# Patient Record
Sex: Male | Born: 1965 | Race: White | Hispanic: No | Marital: Married | Smoking: Never smoker
Health system: Southern US, Community
[De-identification: ages and names within clinical notes are randomized; demographics above are authoritative.]

## PROBLEM LIST (undated history)

## (undated) HISTORY — PX: APPENDECTOMY: SHX54

## (undated) HISTORY — PX: FRACTURE SURGERY: SHX138

---

## 2007-10-28 ENCOUNTER — Inpatient Hospital Stay: Payer: Self-pay | Admitting: Specialist

## 2007-11-05 ENCOUNTER — Emergency Department: Payer: Self-pay | Admitting: Emergency Medicine

## 2007-11-05 ENCOUNTER — Other Ambulatory Visit: Payer: Self-pay

## 2008-02-13 ENCOUNTER — Ambulatory Visit: Payer: Self-pay | Admitting: Family Medicine

## 2008-02-20 ENCOUNTER — Ambulatory Visit: Payer: Self-pay | Admitting: Family Medicine

## 2008-07-02 ENCOUNTER — Ambulatory Visit: Payer: Self-pay | Admitting: Family Medicine

## 2009-02-22 ENCOUNTER — Emergency Department: Payer: Self-pay | Admitting: Emergency Medicine

## 2009-05-18 IMAGING — CR DG CHEST 2V
1 series · 2 of 2 positions shown · non-contrast
Comparison: none

REASON FOR EXAM: fall 15 feet
COMMENTS:

PROCEDURE:     DXR - DXR CHEST PA (OR AP) AND LATERAL  - October 28, 2007  [DATE]
RESULT:     Comparison: No available comparison exam.

[Series 1: view not recorded · 0.17mm/px · 2 of 2 slices shown]
[im 1/2]
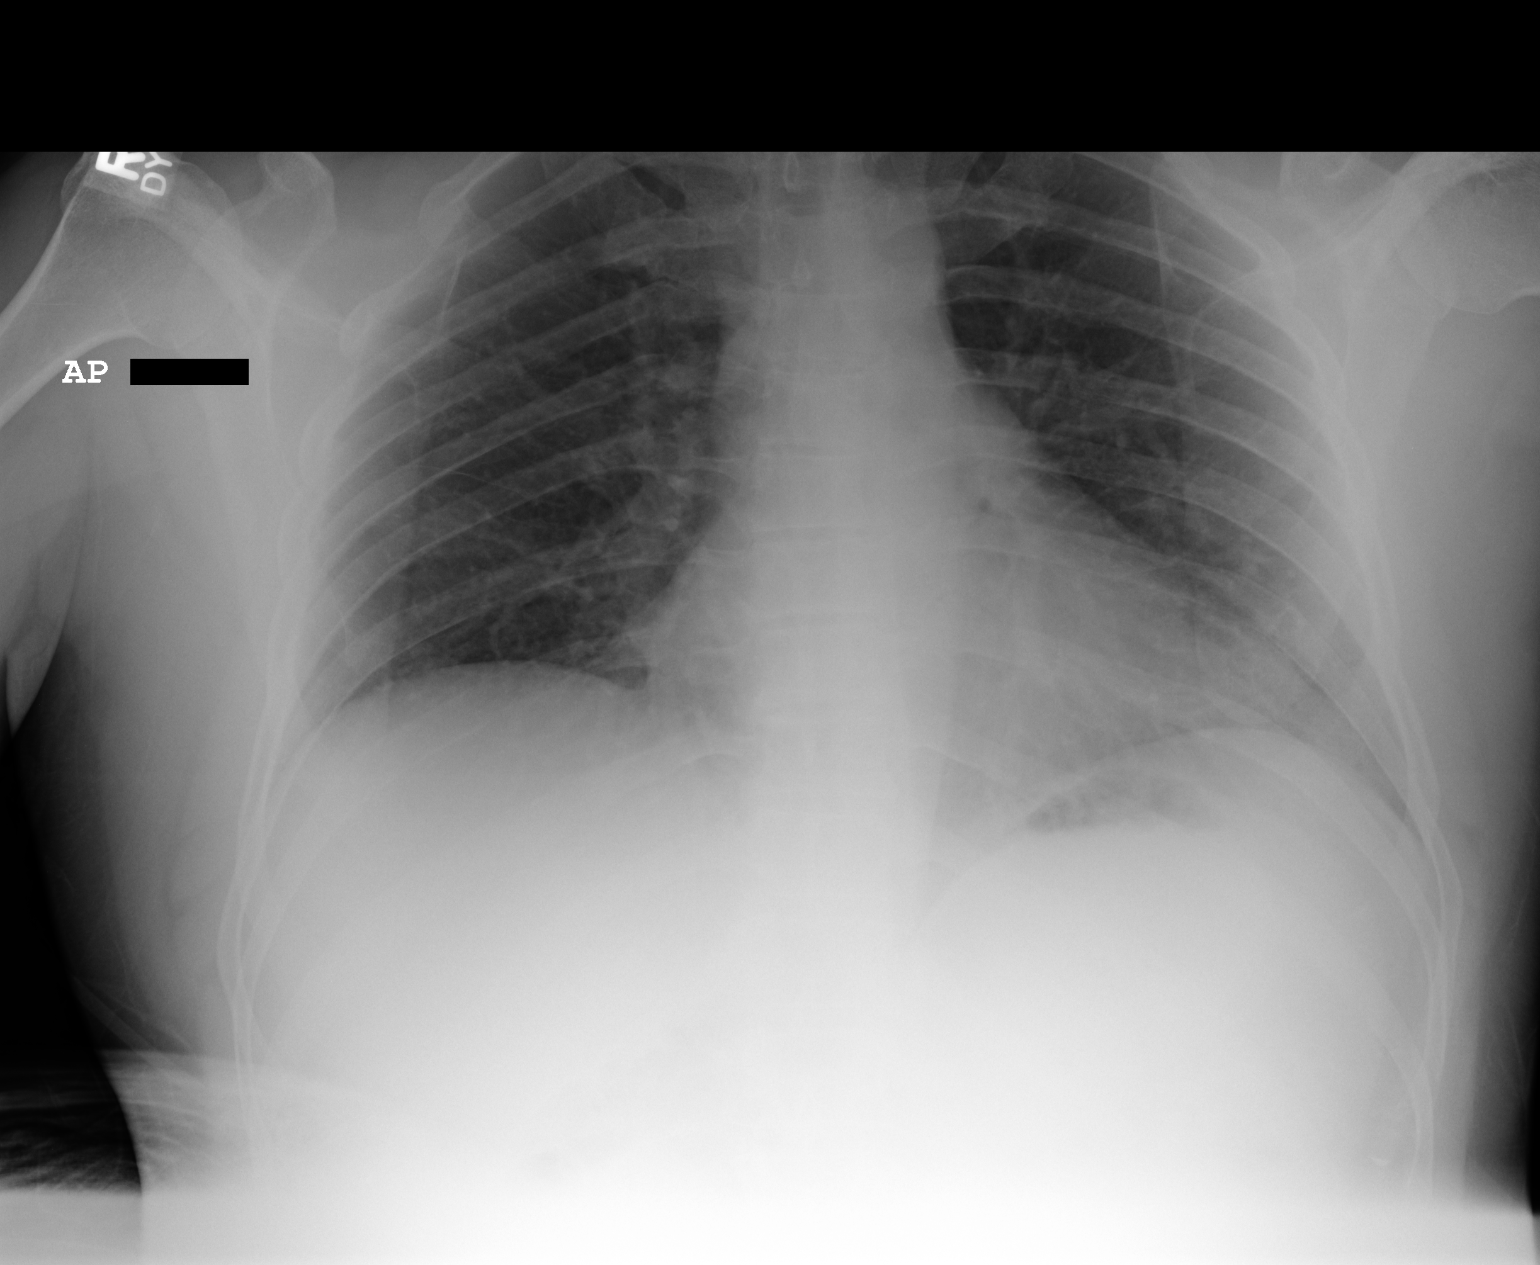
[im 2/2]
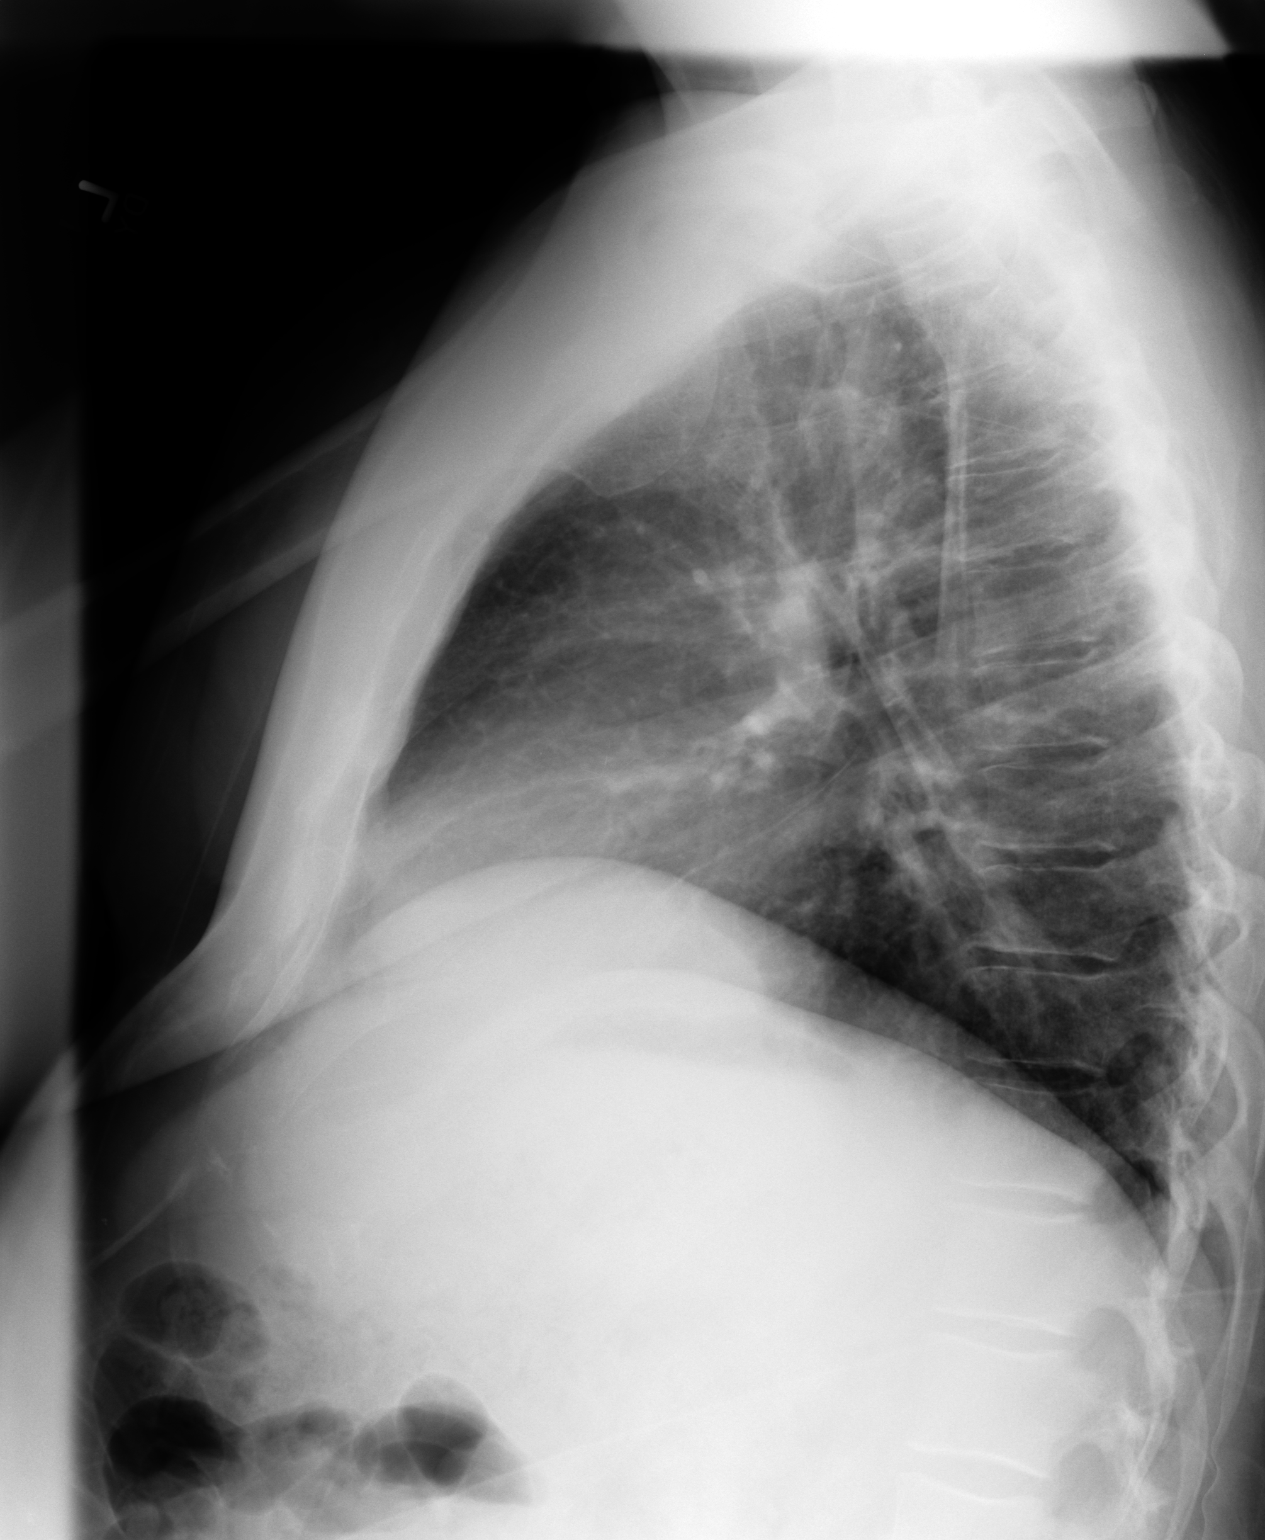

[2 of 2 positions shown; findings below may reference images not displayed]

FINDINGS: Low lung volumes. There is no significant pulmonary consolidation, pulmonary
edema, pleural effusion, nor pneumothorax. The cardiomediastinal silhouette
is within normal limits.

No grossly displaced rib fracture is noted.
IMPRESSION: 1. No acute cardiopulmonary abnormality is noted.

## 2009-05-26 IMAGING — US US EXTREM LOW VENOUS*R*
1 series · 17 of 24 positions shown · non-contrast
Comparison: none

REASON FOR EXAM: pain swelling postop
COMMENTS:

[Series 1: us extrem low venous*right* · 17 of 28 slices shown]
[im 1/28]
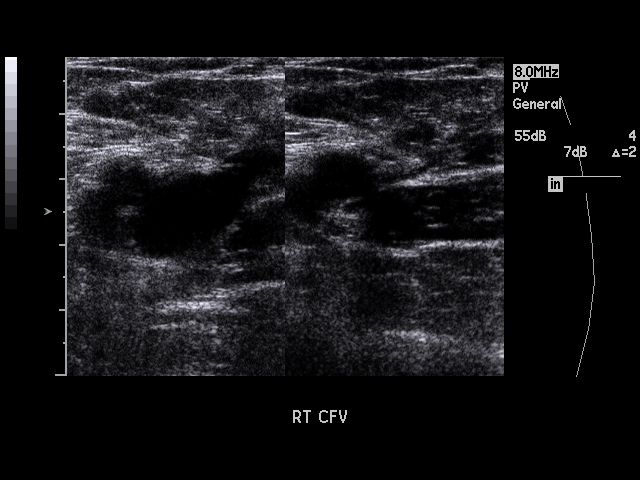
[im 3/28]
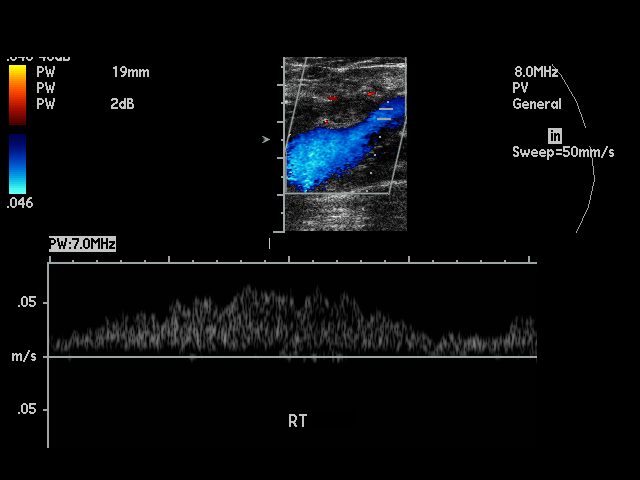
[im 4/28]
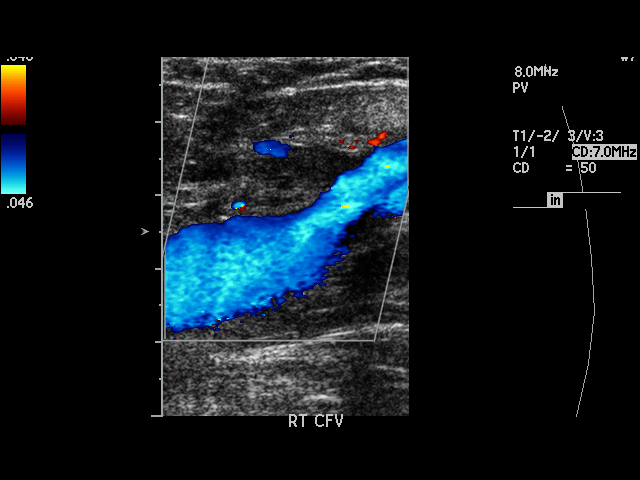
[im 5/28]
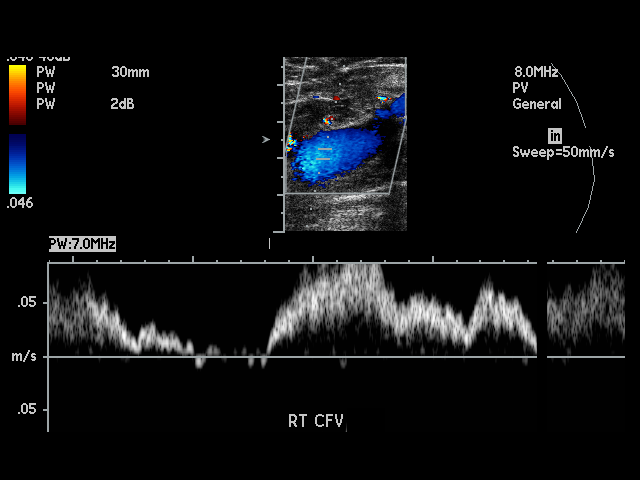
[im 8/28]
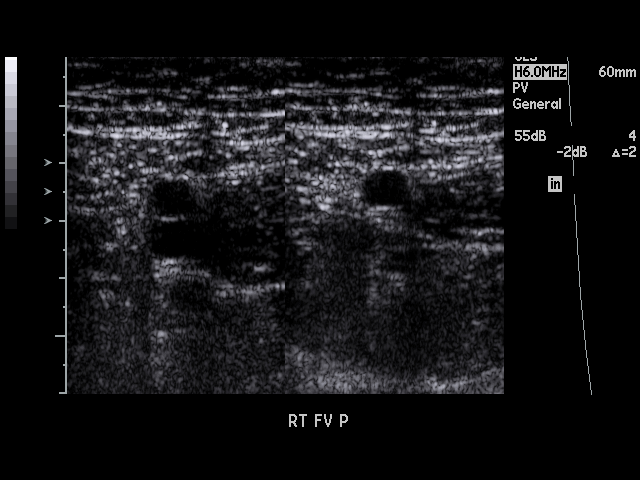
[im 9/28]
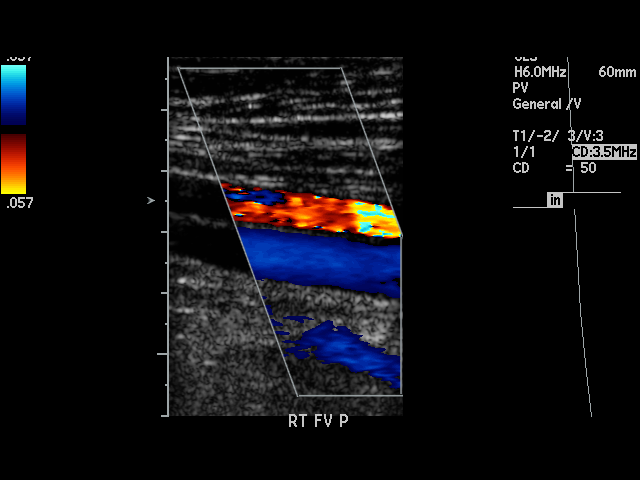
[im 11/28]
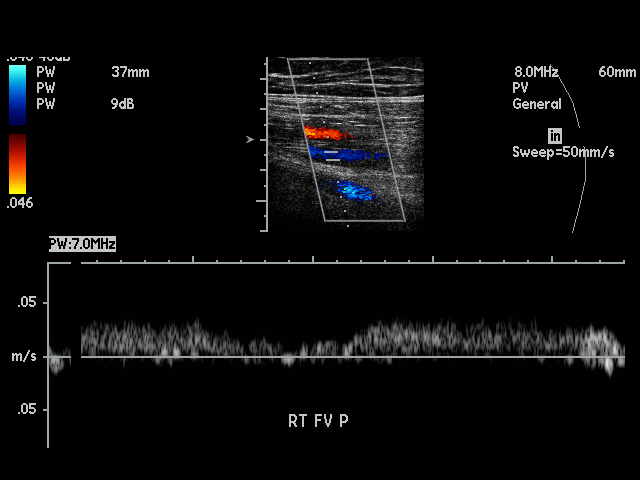
[im 12/28]
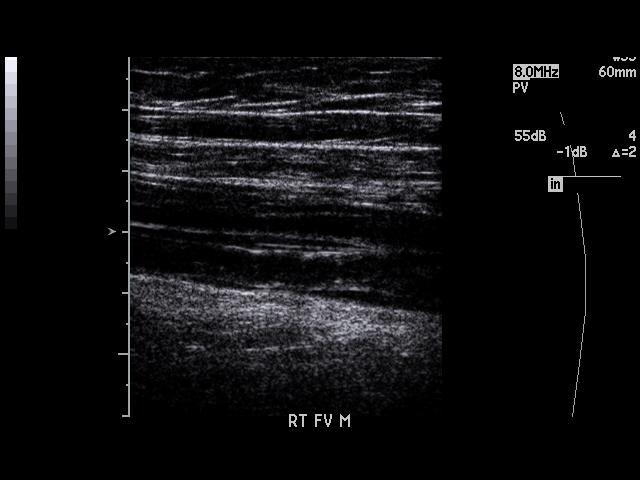
[im 15/28]
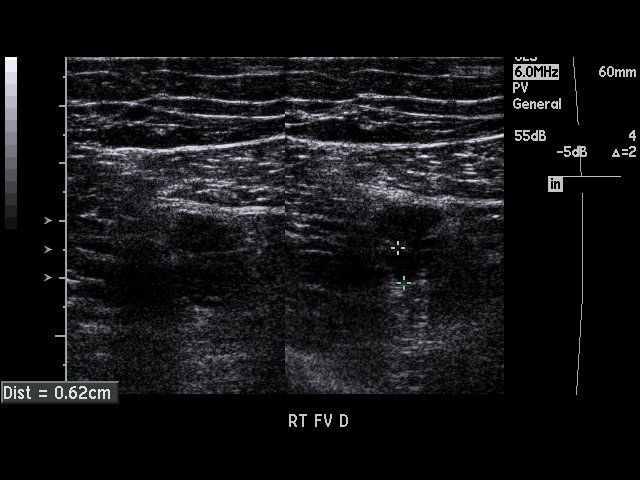
[im 16/28]
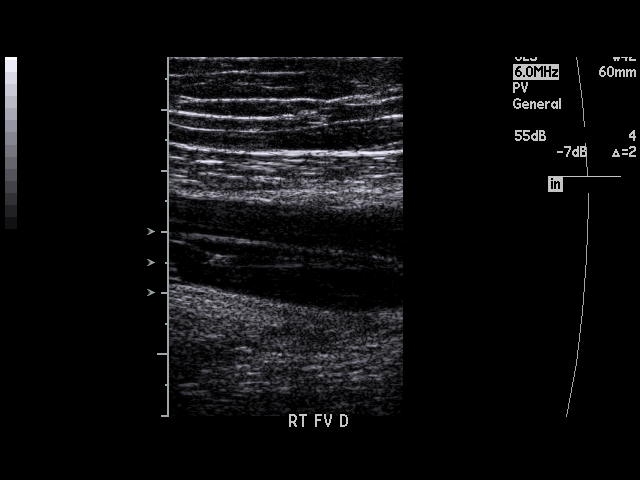
[im 17/28]
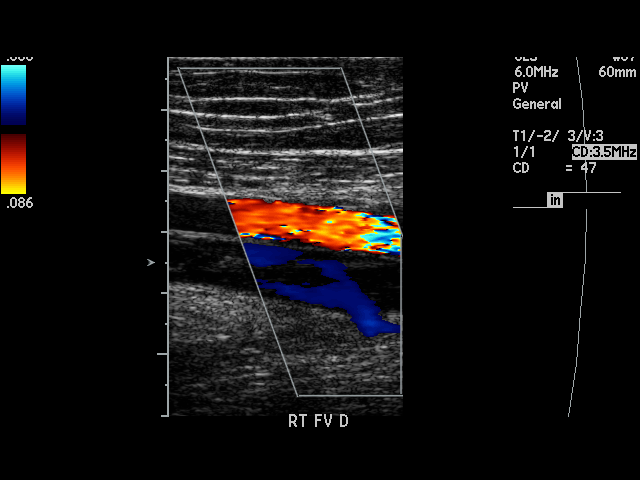
[im 19/28]
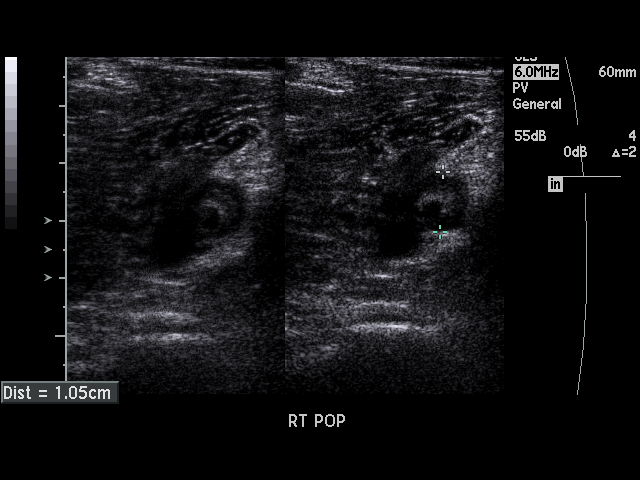
[im 20/28]
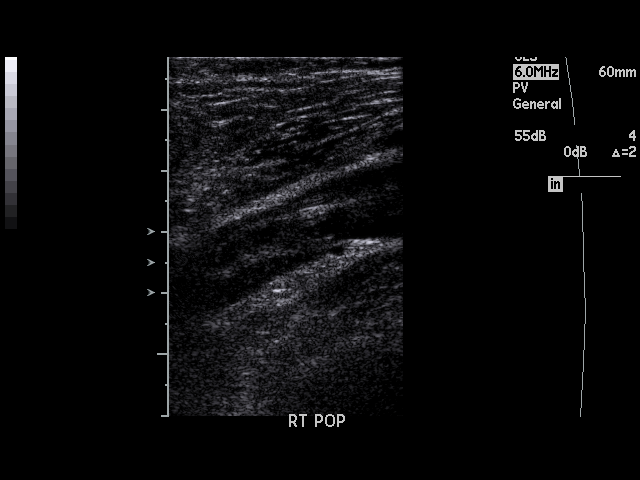
[im 23/28]
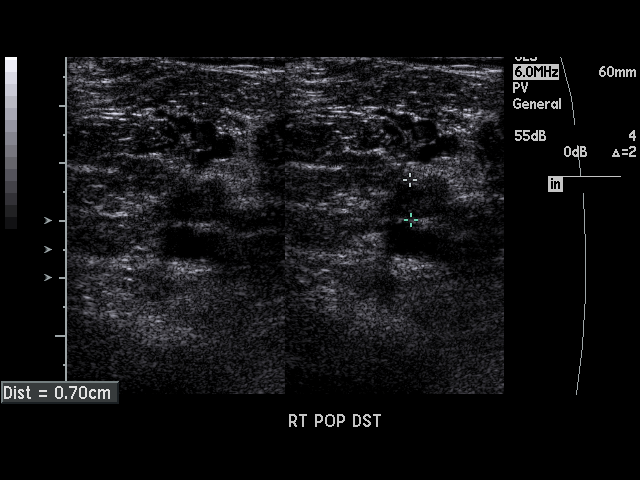
[im 24/28]
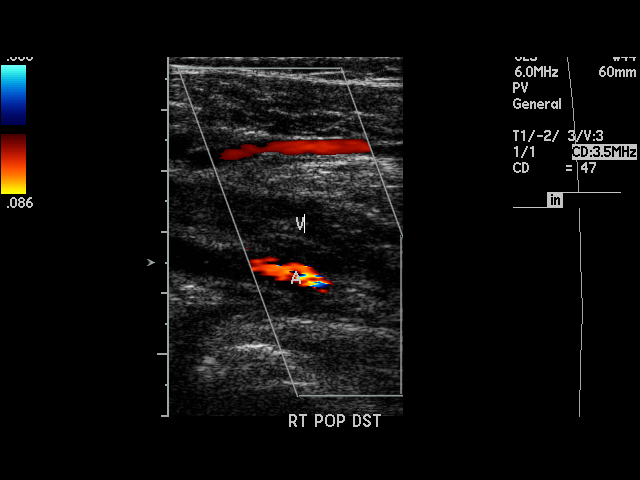
[im 25/28]
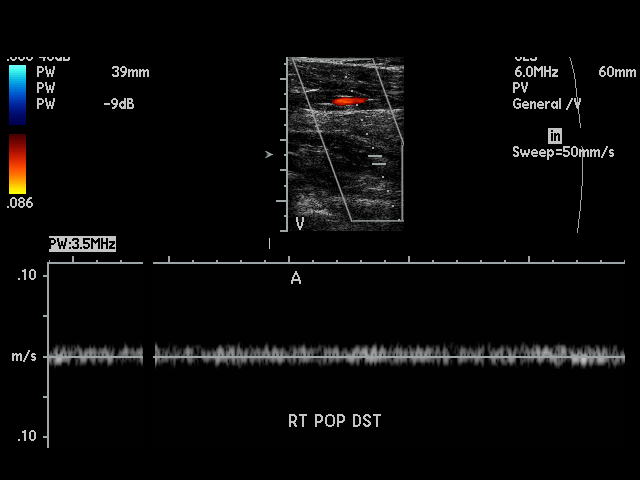
[im 28/28]
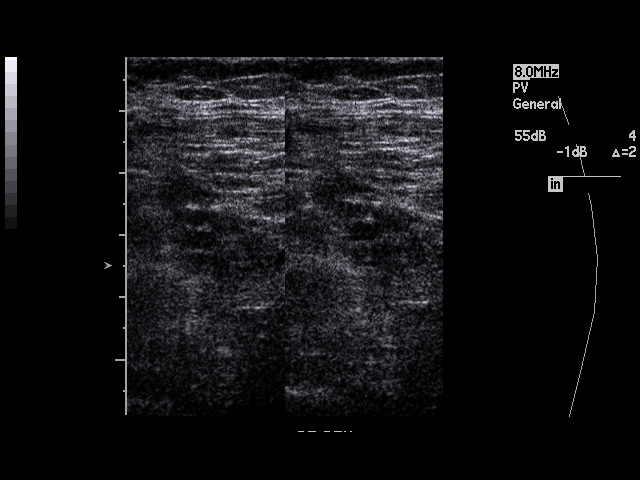

[17 of 24 positions shown; findings below may reference images not displayed]

PROCEDURE:     US  - US DOPPLER LOW EXTR RIGHT  - November 05, 2007  [DATE]

RESULT:     The RIGHT femoral and popliteal veins were evaluated. There is
obstructing thrombus present in the popliteal artery.   There is
nonocclusive clot in the mid to distal superficial femoral vein. The common
femoral vein exhibits no evidence of thrombus.
IMPRESSION: There is obstructing thrombus in the right popliteal vein
and nonobstructing thrombus in the mid and distal right superficial femoral
vein.

This report was called to the [HOSPITAL] the conclusion of the
study.

## 2009-09-03 IMAGING — US US EXTREM LOW VENOUS*R*
1 series · 17 of 24 positions shown · non-contrast
Comparison: none

REASON FOR EXAM: DVT right leg    3 Lex follow up after treatment
COMMENTS:

[Series 1: us extrem low venous*right* · 17 of 31 slices shown]
[im 1/31]
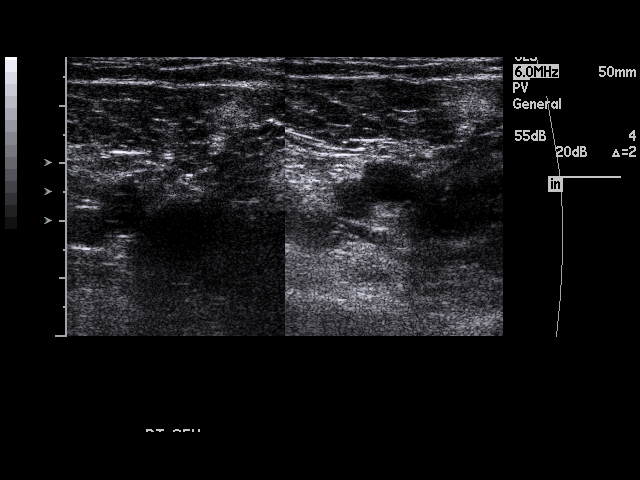
[im 3/31]
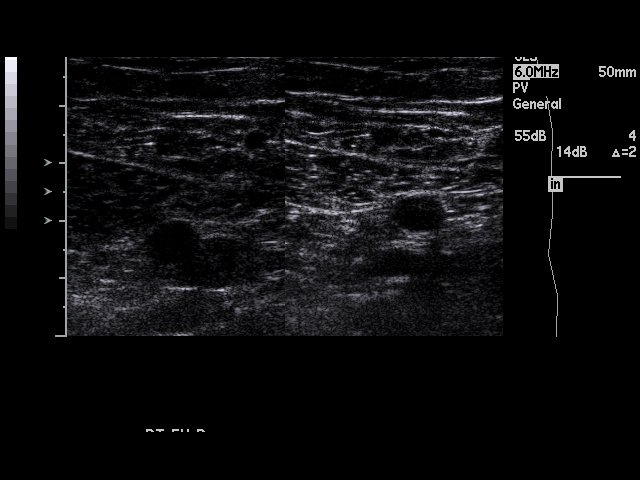
[im 4/31]
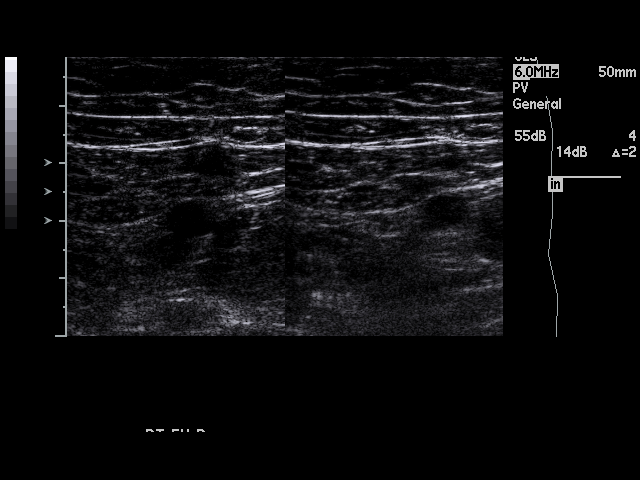
[im 6/31]
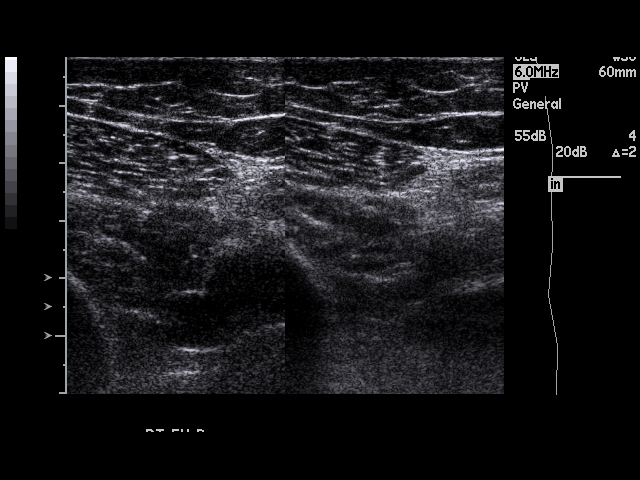
[im 8/31]
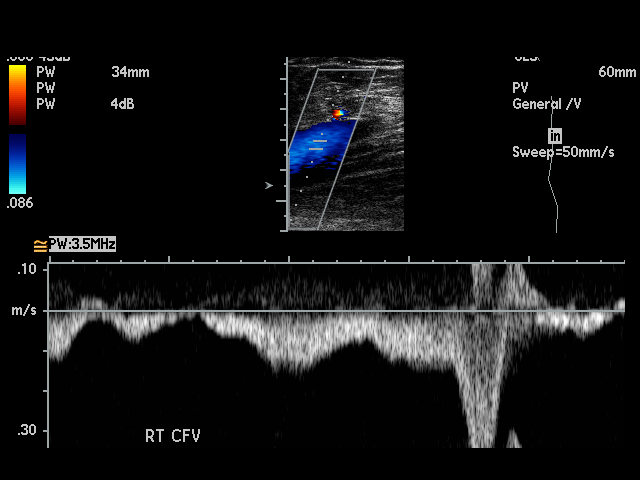
[im 10/31]
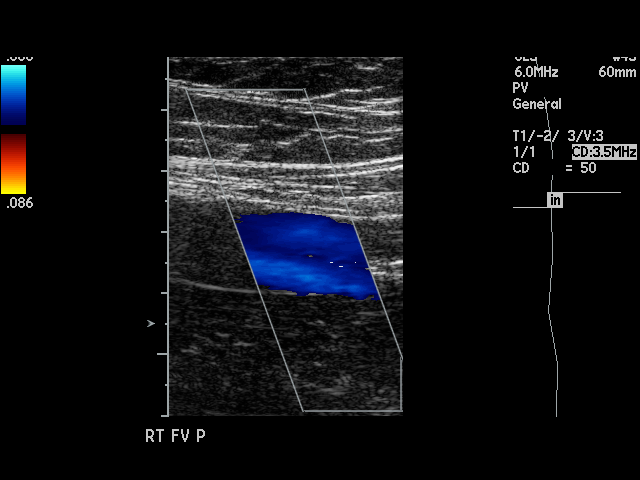
[im 12/31]
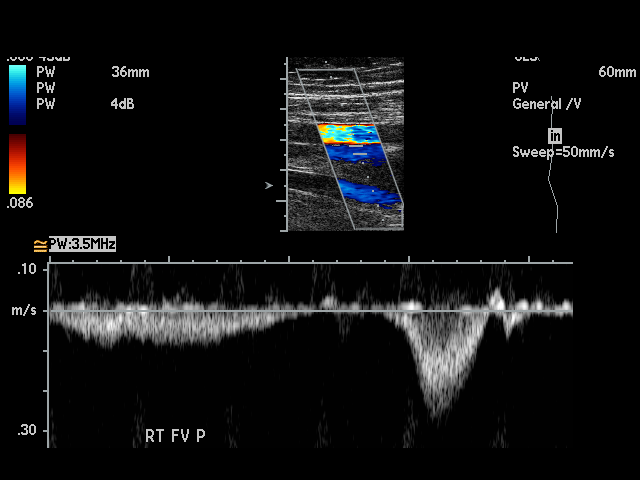
[im 14/31]
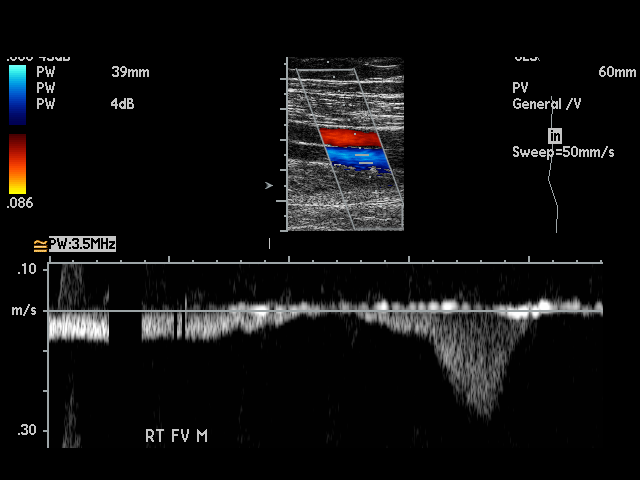
[im 16/31]
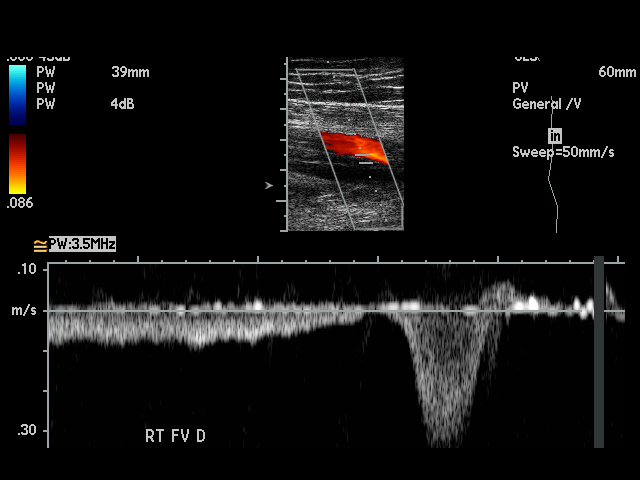
[im 17/31]
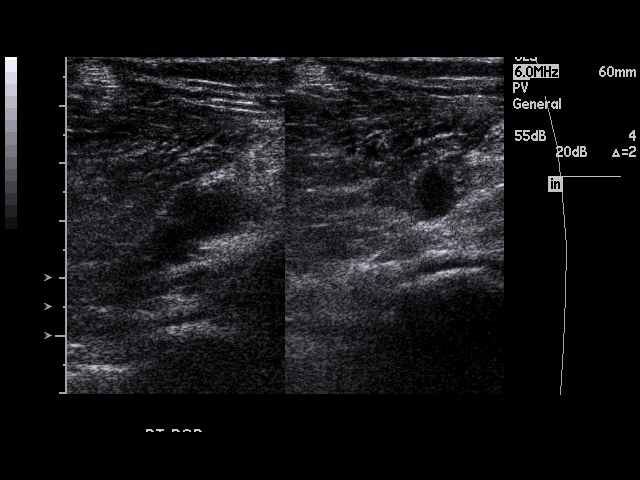
[im 19/31]
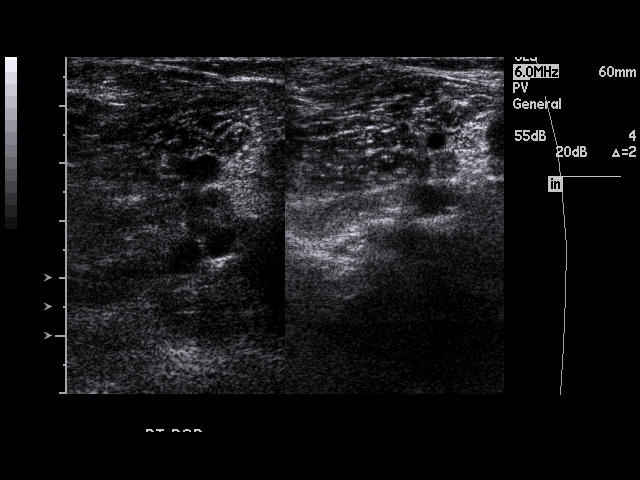
[im 21/31]
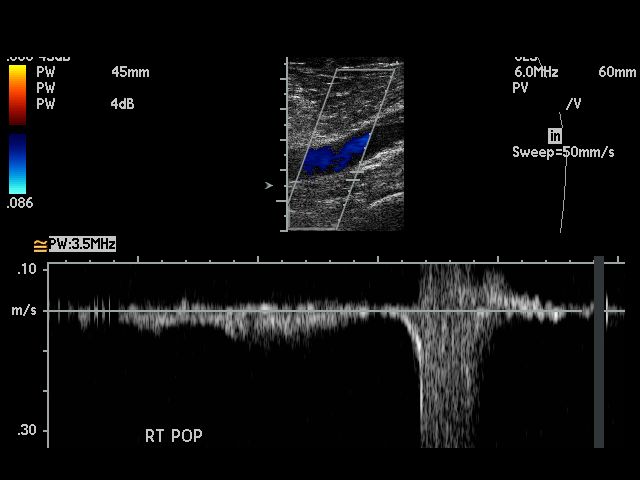
[im 23/31]
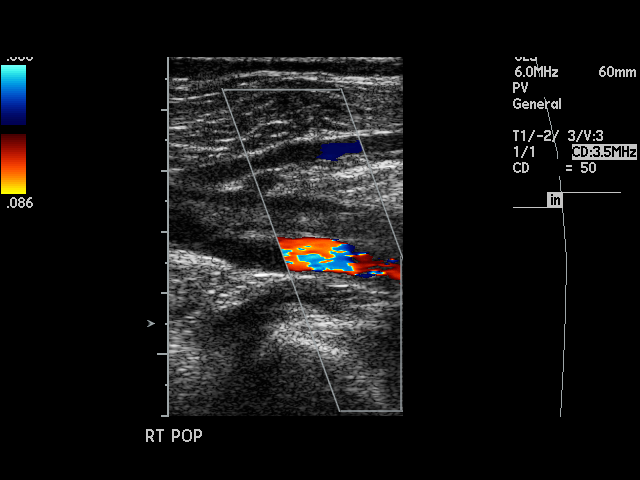
[im 25/31]
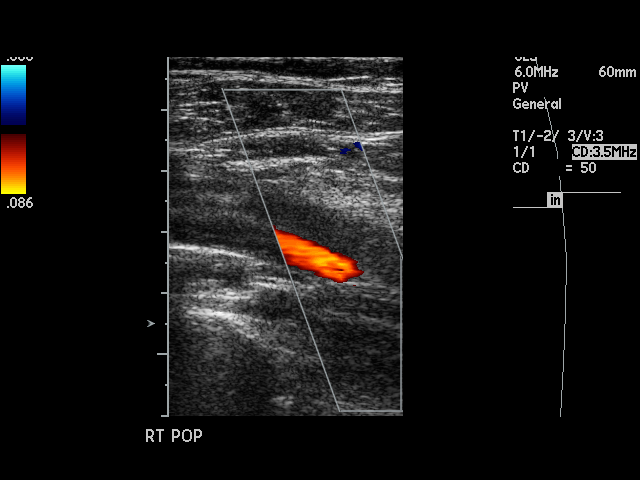
[im 27/31]
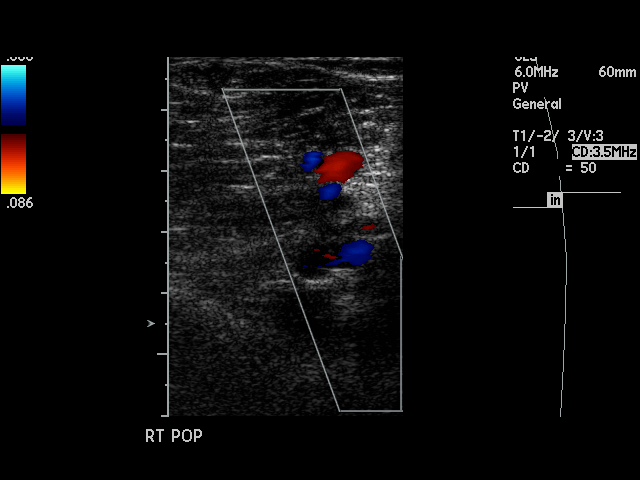
[im 28/31]
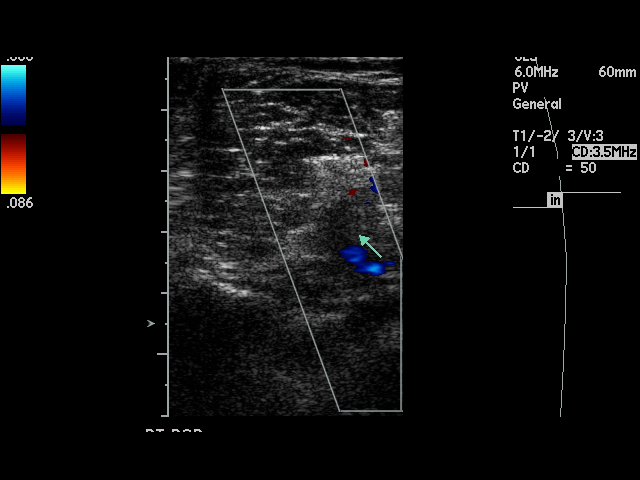
[im 31/31]
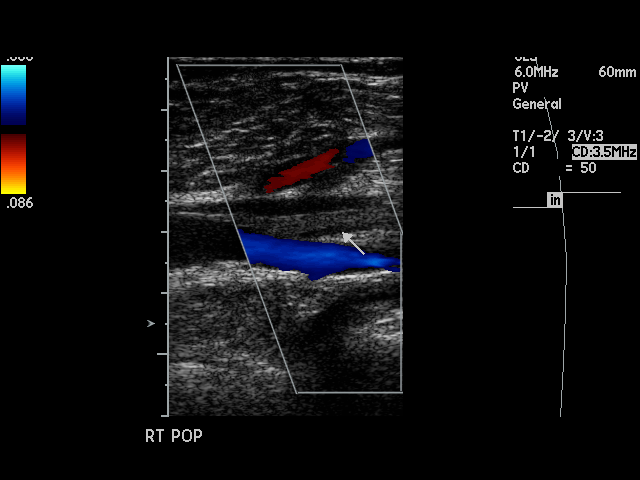

[17 of 24 positions shown; findings below may reference images not displayed]

PROCEDURE:     ADLAHO - ADLAHO DOPPLER LOW EXTR RIGHT  - February 13, 2008  [DATE]

RESULT:     Comparison is made to a prior exam of November 05, 2007. The
current exam shows the common femoral, superficial femoral and proximal
popliteal to be free of thrombus. There persists thrombus in the distal
popliteal where on Doppler examination there is observed popliteal occlusion.
IMPRESSION: 1.     There persists occlusive thrombosis in the distal RIGHT popliteal but
there has been improvement since the prior exam in that the superficial
femoral and upper popliteal are patent.

## 2010-01-21 IMAGING — US US EXTREM LOW VENOUS*R*
1 series · 17 of 23 positions shown · non-contrast
Comparison: none

REASON FOR EXAM: right leg pain     f/u DVT
COMMENTS:

[Series 1: us extrem low venous*right* · 17 of 23 slices shown]
[im 1/23]
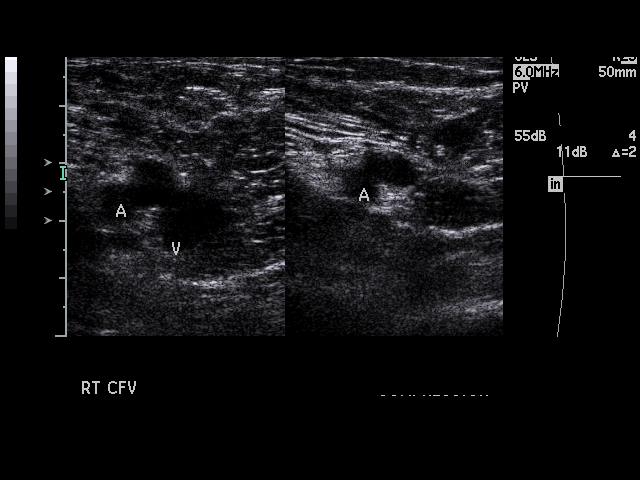
[im 3/23]
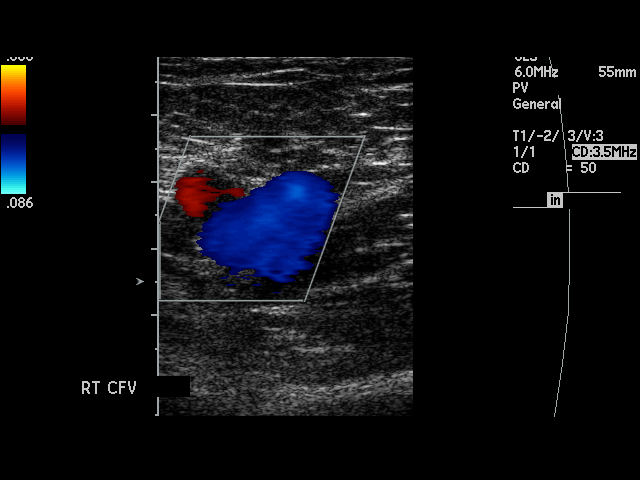
[im 4/23]
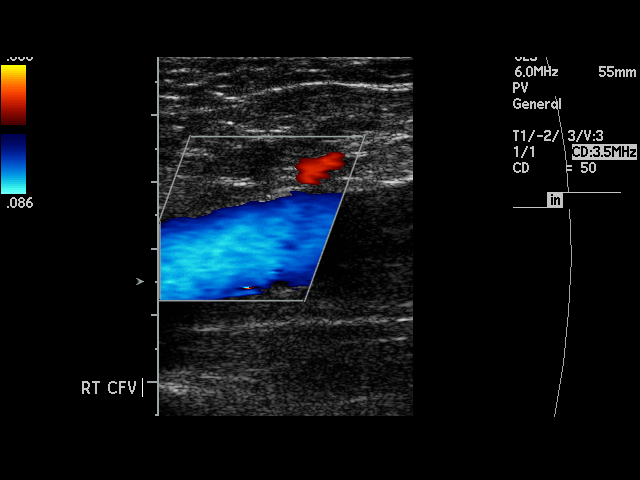
[im 5/23]
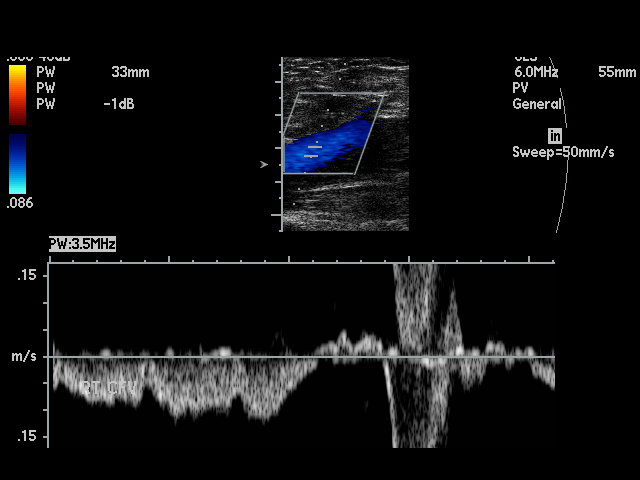
[im 7/23]
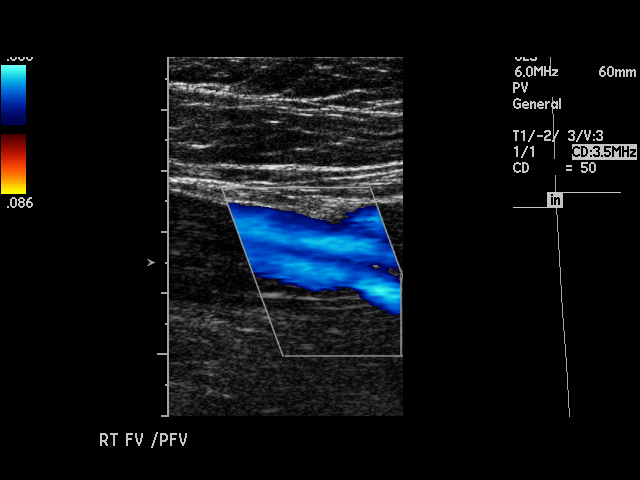
[im 8/23]
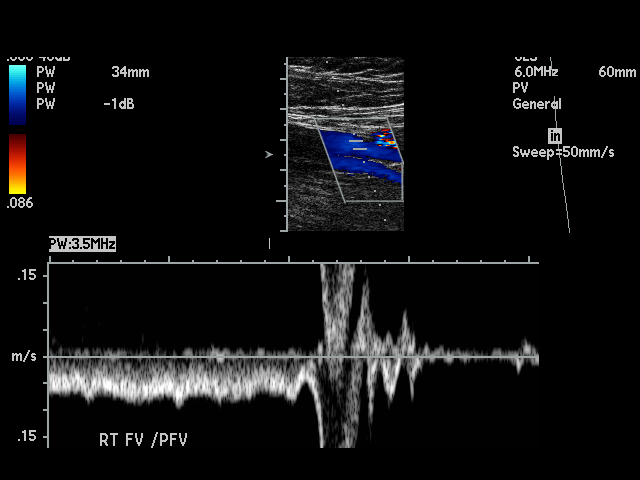
[im 9/23]
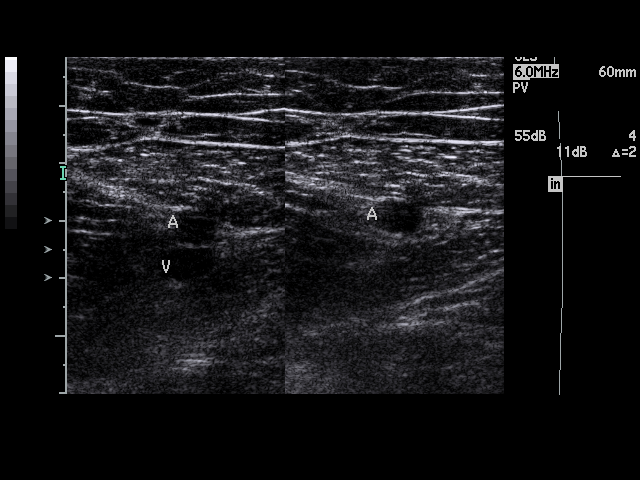
[im 11/23]
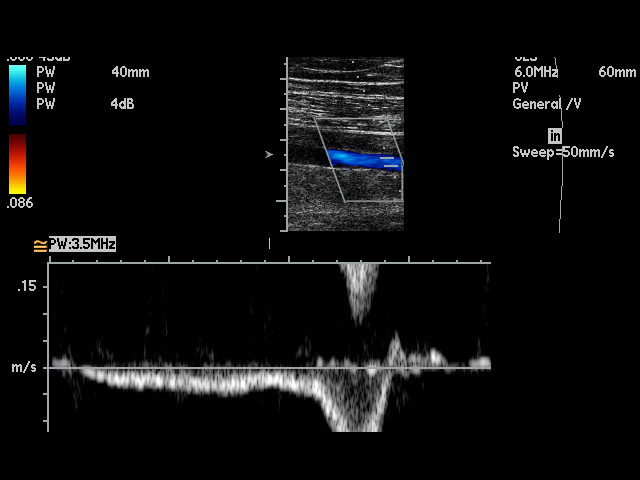
[im 12/23]
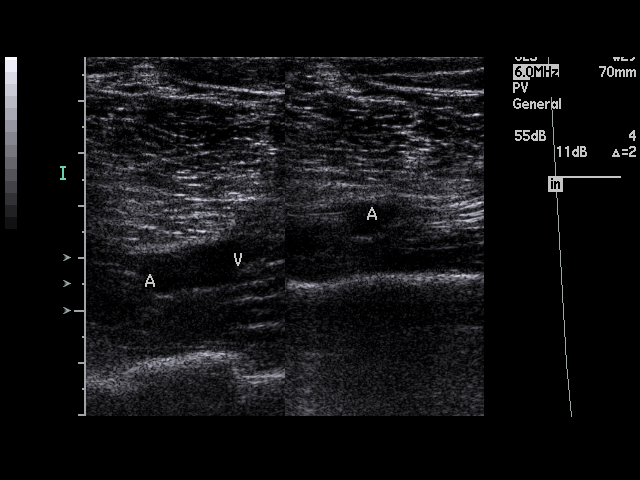
[im 13/23]
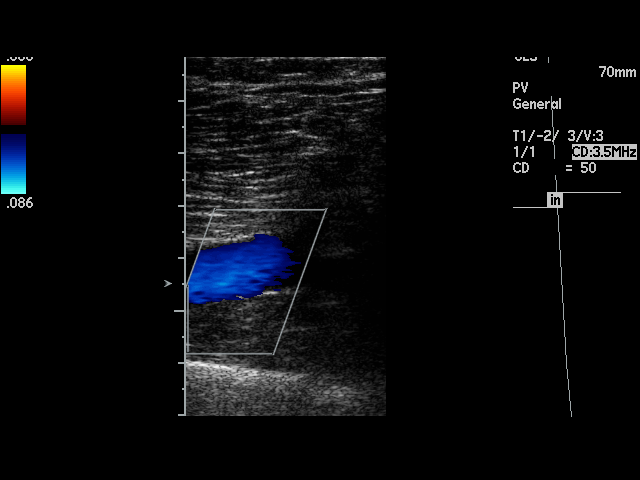
[im 15/23]
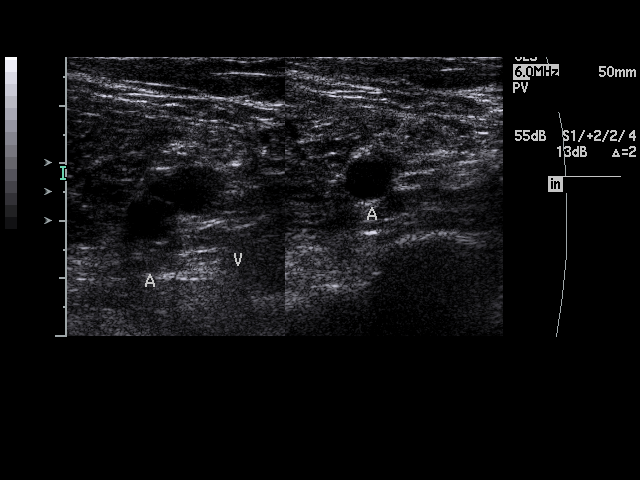
[im 16/23]
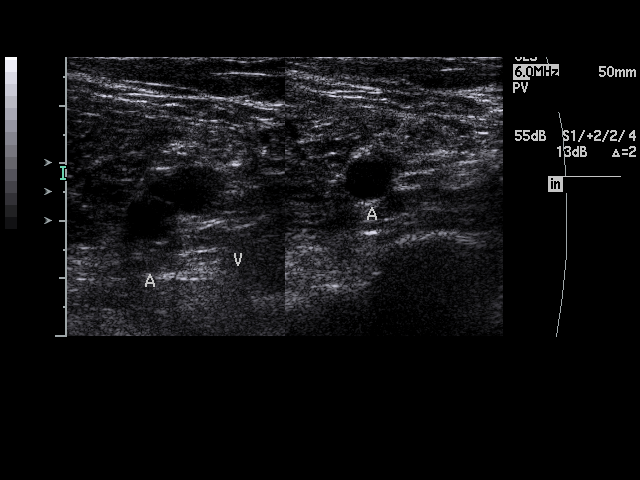
[im 17/23]
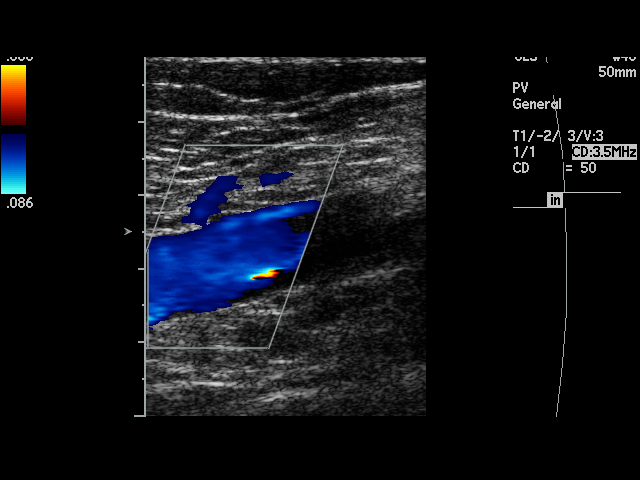
[im 19/23]
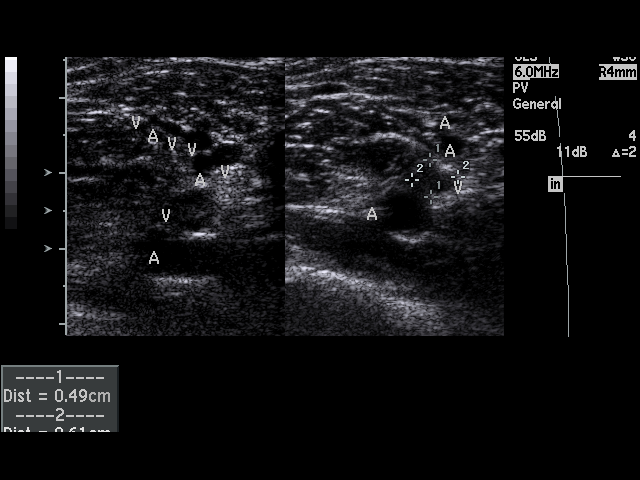
[im 20/23]
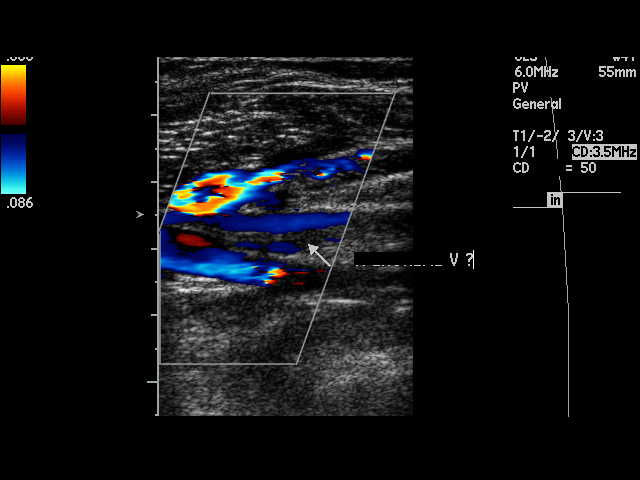
[im 21/23]
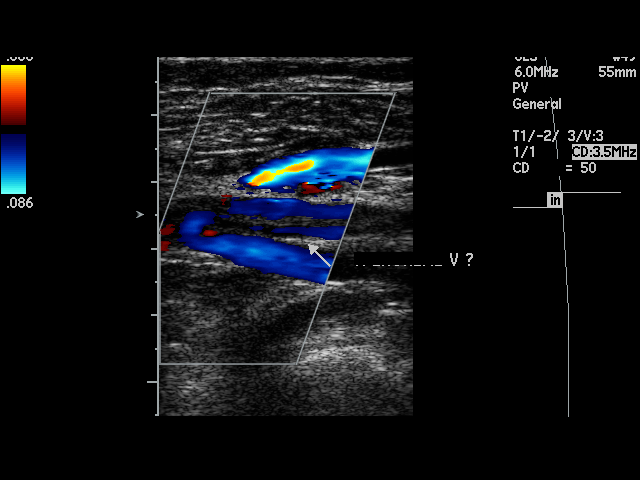
[im 23/23]
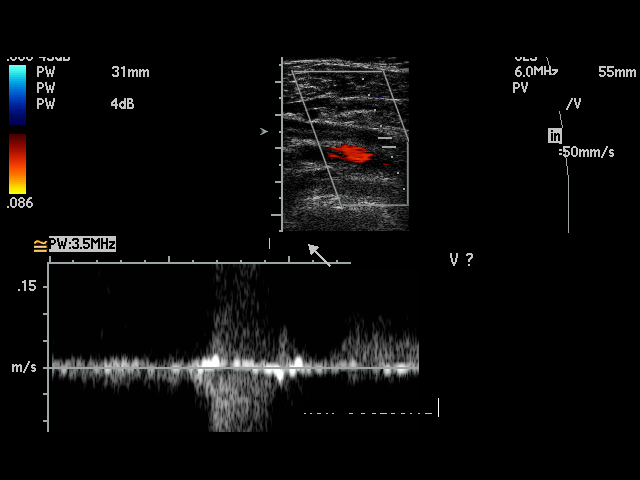

[17 of 23 positions shown; findings below may reference images not displayed]

PROCEDURE:     RTOYOTA - RTOYOTA DOPPLER LOW EXTR RIGHT  - July 02, 2008  [DATE]

RESULT:     Comparison is made to the prior exam of 02/13/08. The current
exam shows a complete compressibility of the common femoral and superficial
femoral vein on the RIGHT. There persists nonocclusive thrombus in the
distal popliteal with some extension of the thrombus below-the-knee into the
peroneal.
IMPRESSION: 1. There has been further improvement since the exam of 02/13/2008. Some
residual thrombus persists in the popliteal and peroneal as noted above but
no occlusive thrombosis is seen in the popliteal at this time.
2. No thrombus above the popliteal is seen.

## 2014-12-28 ENCOUNTER — Ambulatory Visit
Admission: EM | Admit: 2014-12-28 | Discharge: 2014-12-28 | Disposition: A | Discharge status: Home / Self Care | Payer: Federal, State, Local not specified - PPO | Attending: Internal Medicine | Admitting: Internal Medicine

## 2014-12-28 DIAGNOSIS — H6093 Unspecified otitis externa, bilateral: Secondary | ICD-10-CM | POA: Diagnosis not present

## 2014-12-28 DIAGNOSIS — J309 Allergic rhinitis, unspecified: Secondary | ICD-10-CM | POA: Diagnosis not present

## 2014-12-28 DIAGNOSIS — T700XXA Otitic barotrauma, initial encounter: Secondary | ICD-10-CM | POA: Diagnosis not present

## 2014-12-28 MED ORDER — LORATADINE-PSEUDOEPHEDRINE ER 10-240 MG PO TB24: 1.0000 | ORAL_TABLET | Freq: Every day | ORAL | Status: DC

## 2014-12-28 MED ORDER — NEOMYCIN-POLYMYXIN-HC 3.5-10000-1 OT SUSP: 4.0000 [drp] | Freq: Four times a day (QID) | OTIC | Status: DC

## 2014-12-28 NOTE — Discharge Instructions (Signed)
Use ear drops; avoid blowing nose hard until pain subsides.   Allergies Allergies may happen from anything your body is sensitive to. This may be food, medicines, pollens, chemicals, and nearly anything around you in everyday life that produces allergens. An allergen is anything that causes an allergy producing substance. Heredity is often a factor in causing these problems. This means you may have some of the same allergies as your parents. Food allergies happen in all age groups. Food allergies are some of the most severe and life threatening. Some common food allergies are cow's milk, seafood, eggs, nuts, wheat, and soybeans. SYMPTOMS   Swelling around the mouth.  An itchy red rash or hives.  Vomiting or diarrhea.  Difficulty breathing. SEVERE ALLERGIC REACTIONS ARE LIFE-THREATENING. This reaction is called anaphylaxis. It can cause the mouth and throat to swell and cause difficulty with breathing and swallowing. In severe reactions only a trace amount of food (for example, peanut oil in a salad) may cause death within seconds. Seasonal allergies occur in all age groups. These are seasonal because they usually occur during the same season every year. They may be a reaction to molds, grass pollens, or tree pollens. Other causes of problems are house dust mite allergens, pet dander, and mold spores. The symptoms often consist of nasal congestion, a runny itchy nose associated with sneezing, and tearing itchy eyes. There is often an associated itching of the mouth and ears. The problems happen when you come in contact with pollens and other allergens. Allergens are the particles in the air that the body reacts to with an allergic reaction. This causes you to release allergic antibodies. Through a chain of events, these eventually cause you to release histamine into the blood stream. Although it is meant to be protective to the body, it is this release that causes your discomfort. This is why you  were given anti-histamines to feel better. If you are unable to pinpoint the offending allergen, it may be determined by skin or blood testing. Allergies cannot be cured but can be controlled with medicine. Hay fever is a collection of all or some of the seasonal allergy problems. It may often be treated with simple over-the-counter medicine such as diphenhydramine. Take medicine as directed. Do not drink alcohol or drive while taking this medicine. Check with your caregiver or package insert for child dosages. If these medicines are not effective, there are many new medicines your caregiver can prescribe. Stronger medicine such as nasal spray, eye drops, and corticosteroids may be used if the first things you try do not work well. Other treatments such as immunotherapy or desensitizing injections can be used if all else fails. Follow up with your caregiver if problems continue. These seasonal allergies are usually not life threatening. They are generally more of a nuisance that can often be handled using medicine. HOME CARE INSTRUCTIONS   If unsure what causes a reaction, keep a diary of foods eaten and symptoms that follow. Avoid foods that cause reactions.  If hives or rash are present:  Take medicine as directed.  You may use an over-the-counter antihistamine (diphenhydramine) for hives and itching as needed.  Apply cold compresses (cloths) to the skin or take baths in cool water. Avoid hot baths or showers. Heat will make a rash and itching worse.  If you are severely allergic:  Following a treatment for a severe reaction, hospitalization is often required for closer follow-up.  Wear a medic-alert bracelet or necklace stating the allergy.  You and your family must learn how to give adrenaline or use an anaphylaxis kit.  If you have had a severe reaction, always carry your anaphylaxis kit or EpiPen with you. Use this medicine as directed by your caregiver if a severe reaction is  occurring. Failure to do so could have a fatal outcome. SEEK MEDICAL CARE IF:  You suspect a food allergy. Symptoms generally happen within 30 minutes of eating a food.  Your symptoms have not gone away within 2 days or are getting worse.  You develop new symptoms.  You want to retest yourself or your child with a food or drink you think causes an allergic reaction. Never do this if an anaphylactic reaction to that food or drink has happened before. Only do this under the care of a caregiver. SEEK IMMEDIATE MEDICAL CARE IF:   You have difficulty breathing, are wheezing, or have a tight feeling in your chest or throat.  You have a swollen mouth, or you have hives, swelling, or itching all over your body.  You have had a severe reaction that has responded to your anaphylaxis kit or an EpiPen. These reactions may return when the medicine has worn off. These reactions should be considered life threatening. MAKE SURE YOU:   Understand these instructions.  Will watch your condition.  Will get help right away if you are not doing well or get worse. Document Released: 11/02/2002 Document Revised: 12/04/2012 Document Reviewed: 04/08/2008 [facility] Patient Information 2015 [place], [place]. This information is not intended to replace advice given to you by your health care provider. Make sure you discuss any questions you have with your health care provider. Otitis Externa Otitis externa is a germ infection in the outer ear. The outer ear is the area from the eardrum to the outside of the ear. Otitis externa is sometimes called "swimmer's ear." HOME CARE  Put drops in the ear as told by your doctor.  Only take medicine as told by your doctor.  If you have diabetes, your doctor may give you more directions. Follow your doctor's directions.  Keep all doctor visits as told. To avoid another infection:  Keep your ear dry. Use the corner of a towel to dry your ear after swimming or  bathing.  Avoid scratching or putting things inside your ear.  Avoid swimming in lakes, dirty water, or pools that use a chemical called chlorine poorly.  You may use ear drops after swimming. Combine equal amounts of white vinegar and alcohol in a bottle. Put 3 or 4 drops in each ear. GET HELP IF:   You have a fever.  Your ear is still red, puffy (swollen), or painful after 3 days.  You still have yellowish-white fluid (pus) coming from the ear after 3 days.  Your redness, puffiness, or pain gets worse.  You have a really bad headache.  You have redness, puffiness, pain, or tenderness behind your ear. MAKE SURE YOU:   Understand these instructions.  Will watch your condition.  Will get help right away if you are not doing well or get worse. Document Released: 01/26/2008 Document Revised: 12/24/2013 Document Reviewed: 08/26/2011 [facility] Patient Information 2015 [place], [place]. This information is not intended to replace advice given to you by your health care provider. Make sure you discuss any questions you have with your health care provider. Ear Barotrauma Ear barotrauma is ear pain or damage caused by a pressure difference between the inside and outside of the eardrum. Ways this can happen  include:   Being too close to a loud noise.  Coming to the surface too fast after scuba diving.  Being hit hard in the ear.  Flying in an airplane. HOME CARE   Do not do the following until your doctor says it is okay:  Travel to places high above sea level (high altitude).  Work in a pressurized room.  Scuba dive.  Keep your ears dry. Use the corner of a towel to gently get water out of your ears after a shower or bath.  Only take medicines as told by your doctor.  See your doctor for follow-up visits as told. GET HELP RIGHT AWAY IF:   Your pain gets worse or does not get better with medicine.  You have a fever.  You have any fluid (discharge) coming from your  ear.  Your outer ear gets red and puffy (swollen).  The area behind your earlobe gets puffy.  You have problems that may be caused by your medicine. MAKE SURE YOU:   Understand these instructions.  Will watch your condition.  Will get help right away if you are not doing well or get worse. Document Released: 01/27/2010 Document Revised: 11/01/2011 Document Reviewed: 01/27/2010 [facility] Patient Information 2015 [place], [place]. This information is not intended to replace advice given to you by your health care provider. Make sure you discuss any questions you have with your health care provider.

## 2014-12-28 NOTE — ED Provider Notes (Addendum)
CSN: [id number]     Arrival date & time 12/28/14  1416 History   First MD Initiated Contact with Patient 12/28/14 1532     Chief Complaint  Patient presents with  . Otalgia    HPI Left ear pain for the last 4 days. Patient has difficulty with seasonal allergies, and has his usual level of sinus congestion, drainage. He also rides a motorcycle, and has had difficulty with ear pain in the past after motorcycle rides. No drainage from the ear. No hearing loss. Hearing a "frogs peeping" type noise continuously in his right ear, but it's not unpleasant. No fever. No malaise. Uses cotton balls in his ears when he is riding a motorcycle. History reviewed. No pertinent past medical history. History reviewed. No pertinent past surgical history. History reviewed. No pertinent family history. History  Substance Use Topics  . Smoking status: Never Smoker   . Smokeless tobacco: Never Used  . Alcohol Use: Yes     Comment: occasional    Review of Systems  All other systems reviewed and are negative.   Allergies  Review of patient's allergies indicates no known allergies.  Home Medications  Patient occasionally takes plain Allegra.  BP 107/75 mmHg  Pulse 85  Temp(Src) 97 F (36.1 C) (Tympanic)  Resp 20  Ht 5\' 8"  (1.727 m)  Wt 195 lb (88.451 kg)  BMI 29.66 kg/m2  SpO2 98% Physical Exam  Constitutional: He is oriented to person, place, and time. No distress.  Alert, nicely groomed  HENT:  Head: Atraumatic.  Bilateral ear canals are erythematous, slightly swollen, without focal lesion. Patient indicates that his ears are itchy. No debris, no wax. Bilateral TMs are translucent, intact, right is red tinged and left is frankly red. Moderate nasal congestion Throat slightly red  Eyes:  Conjugate gaze, no eye redness/drainage  Neck: Neck supple.  Cardiovascular: Normal rate.   Pulmonary/Chest: No respiratory distress.  Lungs clear, symmetric breath sounds  Abdominal: Soft. He exhibits no  distension. There is no tenderness. There is no guarding.  Musculoskeletal: Normal range of motion.  Neurological: He is alert and oriented to person, place, and time.  Face is symmetric, speech is clear and coherent  Skin: Skin is warm and dry.  No cyanosis  Nursing note and vitals reviewed.   ED Course  Procedures (including critical care time) Labs Review Labs Reviewed - No data to display  Imaging Review No results found.   MDM   1. Otitis externa of both ears   2. Barotrauma, otic, initial encounter   3. Allergic sinusitis    Rx for Cortisporin otic, and Claritin D. Patient to have ears rechecked in a week if not improving. Advised to avoid hard nose blowing into ear pain subsides.    Eustace MooreLaura W Chaselyn Nanney, MD 12/28/14 1556  Eustace MooreLaura W Beda Dula, MD 12/28/14 [phone]

## 2014-12-28 NOTE — ED Notes (Signed)
Started having pain in left ear about 4 days ago.

## 2021-05-25 ENCOUNTER — Ambulatory Visit
Admission: EM | Admit: 2021-05-25 | Discharge: 2021-05-25 | Disposition: A | Discharge status: Home / Self Care | Payer: 59 | Attending: Emergency Medicine | Admitting: Emergency Medicine

## 2021-05-25 ENCOUNTER — Other Ambulatory Visit: Payer: Self-pay

## 2021-05-25 ENCOUNTER — Encounter: Payer: Self-pay | Admitting: Emergency Medicine

## 2021-05-25 DIAGNOSIS — T23222A Burn of second degree of single left finger (nail) except thumb, initial encounter: Secondary | ICD-10-CM | POA: Diagnosis not present

## 2021-05-25 NOTE — ED Provider Notes (Signed)
[facility]    CSN: [id number] Arrival date & time: 05/25/21  0865      History   Chief Complaint Chief Complaint  Patient presents with   Burn    Left ring finger    HPI ACEL NATZKE is a 55 y.o. male.   HPI  55 year old male here for evaluation of burn.  Patient reports that 8 days ago he was working on a 625 amp, 12 V marine battery when his wrench came in contact with one of the terminals and he grounded himself with his wedding band.  He sustained a two thirds circumferential burn to the proximal aspect of his left ring finger.  He states he remove the ring right away and some skin came away with it.  He has been keeping the area clean and dry.  He has been using Neosporin and changing his dressing 3 times a day for the last 8 days.  He is concerned because there is some yellow drainage with a hint of red and he wants to make sure that it is not infected.  He denies any fever, red streaks going up his hand, numbness or tingling of his finger, or changes to his range of motion ability.  History reviewed. No pertinent past medical history.  There are no problems to display for this patient.   Past Surgical History:  Procedure Laterality Date   APPENDECTOMY     FRACTURE SURGERY         Home Medications    Prior to Admission medications   Not on File    Family History History reviewed. No pertinent family history.  Social History Social History   Tobacco Use   Smoking status: Never   Smokeless tobacco: Never  Vaping Use   Vaping Use: Never used  Substance Use Topics   Alcohol use: Yes    Comment: occasional   Drug use: No     Allergies   Patient has no known allergies.   Review of Systems Review of Systems  Constitutional:  Negative for activity change, appetite change and fever.  Skin:  Positive for wound.       Burn to left ring finger.  Neurological:  Negative for weakness and numbness.  Hematological: Negative.    Psychiatric/Behavioral: Negative.      Physical Exam Triage Vital Signs ED Triage Vitals  Enc Vitals Group     BP 05/25/21 0849 (!) 131/92     Pulse Rate 05/25/21 0849 81     Resp 05/25/21 0849 18     Temp 05/25/21 0849 98.2 F (36.8 C)     Temp Source 05/25/21 0849 Oral     SpO2 05/25/21 0849 99 %     Weight 05/25/21 0847 195 lb 1.7 oz (88.5 kg)     Height 05/25/21 0847 5\' 8"  (1.727 m)     Head Circumference --      Peak Flow --      Pain Score 05/25/21 0847 0     Pain Loc --      Pain Edu? --      Excl. in GC? --    No data found.  Updated Vital Signs BP (!) 131/92 (BP Location: Left Arm)   Pulse 81   Temp 98.2 F (36.8 C) (Oral)   Resp 18   Ht 5\' 8"  (1.727 m)   Wt 195 lb 1.7 oz (88.5 kg)   SpO2 99%   BMI 29.67 kg/m   Visual Acuity  Right Eye Distance:   Left Eye Distance:   Bilateral Distance:    Right Eye Near:   Left Eye Near:    Bilateral Near:     Physical Exam Vitals and nursing note reviewed.  Constitutional:      General: He is not in acute distress.    Appearance: Normal appearance. He is normal weight. He is not ill-appearing.  Musculoskeletal:        General: No swelling, tenderness or deformity. Normal range of motion.  Skin:    General: Skin is warm and dry.     Capillary Refill: Capillary refill takes less than 2 seconds.     Findings: Erythema present. No bruising or lesion.  Neurological:     General: No focal deficit present.     Mental Status: He is alert and oriented to person, place, and time.     Sensory: No sensory deficit.     Motor: No weakness.  Psychiatric:        Mood and Affect: Mood normal.        Behavior: Behavior normal.        Thought Content: Thought content normal.        Judgment: Judgment normal.     UC Treatments / Results  Labs (all labs ordered are listed, but only abnormal results are displayed) Labs Reviewed - No data to display  EKG   Radiology No results found.  Procedures Procedures  (including critical care time)  Medications Ordered in UC Medications - No data to display  Initial Impression / Assessment and Plan / UC Course  I have reviewed the triage vital signs and the nursing notes.  Pertinent labs & imaging results that were available during my care of the patient were reviewed by me and considered in my medical decision making (see chart for details).  Appearing 55 year old male here for evaluation of a burn sustained to the proximal and medial aspect of his left fourth finger 8 days ago.  The burn occurred when he grounded a wrench between his wedding band and one of the terminals on marine battery.  He states he remove the ring right away and some skin came along with it.  He has been keeping the area clean and dry and dressing it with gauze and Neosporin.  He change his dressing 3 times a day.  He is here today for evaluation because he is concerned about the drainage.  He has had no numbness or tingling in the finger, no changes through range of motion, no fever, and no restrictions in his arm.  He states the drainage is mostly yellow with a hint of red at the tip he says.  Patient's physical exam reveals some erythema to the volar, medial, and dorsal aspect of the left ring finger.  The medial aspect is spared.  There are no blisters in place.  There is a small triangular section at the superior lateral aspect of the dorsal branch of the burn that is healthy granular tissue.  There is no drainage appreciable from the wound.  The erythema that surrounds this area of granular tissue is blanchable.  He has full sensation and range of motion of that finger.  Cap refills less than 2 seconds.  The wound seems to be healing well and I have encouraged him to stop using ointment and to keep a dry dressing on when he is at work until a scab formed over the exposed wound bed.  He is to keep  it open to air when at home.  There is no evidence of infection as there is no erythema,  induration, fluctuance, or red streaks ascending his hand.  Work note provided.   Final Clinical Impressions(s) / UC Diagnoses   Final diagnoses:  Partial thickness burn of finger of left hand, initial encounter     Discharge Instructions      Continue to keep your wound clean and dry.  Stop applying the Neosporin as this is macerating the tissue and preventing scab formation.  Keep a dry dressing (Band-Aid).  On your left ring finger over the area of exposed tissue until a scab is formed.  Once a scab is formed you can stop wearing the Band-Aid.  You need to wear the Band-Aid when out in public or at work but she can keep the wound open to air when at home.  If you develop any increased redness around the wound, swelling of your finger, red streaks ascending your arm, or fever you need to return for reevaluation.     ED Prescriptions   None    PDMP not reviewed this encounter.   Becky Augusta, NP 05/25/21 [phone]

## 2021-05-25 NOTE — Discharge Instructions (Addendum)
Continue to keep your wound clean and dry.  Stop applying the Neosporin as this is macerating the tissue and preventing scab formation.  Keep a dry dressing (Band-Aid).  On your left ring finger over the area of exposed tissue until a scab is formed.  Once a scab is formed you can stop wearing the Band-Aid.  You need to wear the Band-Aid when out in public or at work but she can keep the wound open to air when at home.  If you develop any increased redness around the wound, swelling of your finger, red streaks ascending your arm, or fever you need to return for reevaluation.

## 2021-05-25 NOTE — ED Triage Notes (Signed)
Pt states he burned his finger about 8 days ago. He states the burn is oozing and would like it to be checked for infection.
